# Patient Record
Sex: Female | Born: 1965 | Race: White | Hispanic: No | Marital: Married | Smoking: Never smoker
Health system: Southern US, Community
[De-identification: ages and names within clinical notes are randomized; demographics above are authoritative.]

---

## 2010-12-22 DIAGNOSIS — M25571 Pain in right ankle and joints of right foot: Secondary | ICD-10-CM | POA: Insufficient documentation

## 2011-12-03 ENCOUNTER — Emergency Department (INDEPENDENT_AMBULATORY_CARE_PROVIDER_SITE_OTHER): Payer: BC Managed Care – PPO

## 2011-12-03 ENCOUNTER — Encounter: Payer: Self-pay | Admitting: Emergency Medicine

## 2011-12-03 ENCOUNTER — Emergency Department (INDEPENDENT_AMBULATORY_CARE_PROVIDER_SITE_OTHER)
Admission: EM | Admit: 2011-12-03 | Discharge: 2011-12-03 | Disposition: A | Discharge status: Home / Self Care | Payer: BC Managed Care – PPO | Source: Home / Self Care

## 2011-12-03 DIAGNOSIS — M79609 Pain in unspecified limb: Secondary | ICD-10-CM

## 2011-12-03 DIAGNOSIS — M722 Plantar fascial fibromatosis: Secondary | ICD-10-CM

## 2011-12-03 NOTE — ED Notes (Signed)
Right heel pain x 1 month

## 2011-12-03 NOTE — ED Provider Notes (Signed)
History     CSN: [id number]  Arrival date & time 12/03/11  1712   First MD Initiated Contact with Patient 12/03/11 1715      Chief Complaint  Patient presents with  . Foot Pain  HPI Comments: Has noticed R heel pain x 1 month.  This has never happened before.  Pain predominantly on medial aspect of heel.  Pain worse in am  Also after prolonged standing.  Pt states that she works on feet all day.  No numbness or tingling.    Patient is a 46 y.o. female presenting with lower extremity pain.  Foot Pain This is a new problem. Episode onset: 1 month  The problem occurs constantly. Exacerbated by: prolonged standing; pain initially flares in am and then progressively improves over 1st 3-4 hours  Relieved by: rest  Treatments tried: tylenol and NSAIDs.  The treatment provided mild relief.    History reviewed. No pertinent past medical history.  History reviewed. No pertinent past surgical history.  Family History  Problem Relation Age of Onset  . Hypertension Father   . Diabetes Father     History  Substance Use Topics  . Smoking status: Not on file  . Smokeless tobacco: Not on file  . Alcohol Use:     OB History    Grav Para Term Preterm Abortions TAB SAB Ect Mult Living                  Review of Systems  All other systems reviewed and are negative.    Allergies  Penicillins and Sulfa antibiotics  Home Medications  No current outpatient prescriptions on file.  BP 113/74  Pulse 77  Temp 98.1 F (36.7 C) (Oral)  Resp 16  Ht 5\' 5"  (1.651 m)  Wt 160 lb (72.576 kg)  BMI 26.63 kg/m2  SpO2 97%  LMP 11/10/2011  Physical Exam  Constitutional: She appears well-developed and well-nourished.  HENT:  Head: Normocephalic and atraumatic.  Eyes: Conjunctivae are normal. Pupils are equal, round, and reactive to light.  Neck: Normal range of motion. Neck supple.  Cardiovascular: Normal rate and regular rhythm.   Pulmonary/Chest: Effort normal and breath sounds  normal.  Musculoskeletal:       Feet:    ED Course  Procedures (including critical care time)  Labs Reviewed - No data to display No results found.   No diagnosis found.    MDM  Exam and history clinically consistent with plantar fasciitis.  Will place in heel lift.  Discussed proper shoe cushioning and general exercises.  Follow up with sports medicine in 1-2 weeks.  Handout given Follow up as needed.      The patient and/or caregiver has been counseled thoroughly with regard to treatment plan and/or medications prescribed including dosage, schedule, interactions, rationale for use, and possible side effects and they verbalize understanding. Diagnoses and expected course of recovery discussed and will return if not improved as expected or if the condition worsens. Patient and/or caregiver verbalized understanding.             Floydene Flock, MD 12/03/11 [phone]

## 2011-12-09 NOTE — ED Provider Notes (Signed)
Agree with exam, assessment, and plan.   Stephen A Beese, MD 12/09/11 1547 

## 2013-01-21 ENCOUNTER — Emergency Department
Admission: EM | Admit: 2013-01-21 | Discharge: 2013-01-21 | Disposition: A | Discharge status: Home / Self Care | Payer: BC Managed Care – PPO | Source: Home / Self Care | Attending: Family Medicine | Admitting: Family Medicine

## 2013-01-21 ENCOUNTER — Encounter: Payer: Self-pay | Admitting: Emergency Medicine

## 2013-01-21 DIAGNOSIS — J069 Acute upper respiratory infection, unspecified: Secondary | ICD-10-CM

## 2013-01-21 MED ORDER — GUAIFENESIN-CODEINE 100-10 MG/5ML PO SYRP: ORAL_SOLUTION | ORAL | Status: DC

## 2013-01-21 MED ORDER — AZITHROMYCIN 250 MG PO TABS: ORAL_TABLET | ORAL | Status: DC

## 2013-01-21 NOTE — ED Notes (Signed)
Patient reports congestion and greenish productive cough x 1 week; here with granddaughter who has same condition.

## 2013-01-21 NOTE — ED Provider Notes (Signed)
CSN: [id number]     Arrival date & time 01/21/13  1653 History   First MD Initiated Contact with Patient 01/21/13 1730     Chief Complaint  Patient presents with  . Nasal Congestion  . Cough      HPI Comments: Patient developed URI symptoms one week ago with mild sore throat, sinus congestion, and cough.  The cough is now worse at night and non-productive.  No fevers, chills, and sweats.  Her grand-daughter developed similar symptoms at the same time.  The history is provided by the patient.    History reviewed. No pertinent past medical history. History reviewed. No pertinent past surgical history. Family History  Problem Relation Age of Onset  . Hypertension Father   . Diabetes Father   . Diabetes Mother   . Heart failure Mother    History  Substance Use Topics  . Smoking status: Never Smoker   . Smokeless tobacco: Not on file  . Alcohol Use: No   OB History   Grav Para Term Preterm Abortions TAB SAB Ect Mult Living                 Review of Systems + sore throat + cough No pleuritic pain + wheezing + nasal congestion + post-nasal drainage No sinus pain/pressure No itchy/red eyes ? earache No hemoptysis No SOB No fever, + chills No nausea No vomiting No abdominal pain No diarrhea No urinary symptoms No skin rashes + fatigue + myalgias + headache Used OTC meds without relief  Allergies  Penicillins and Sulfa antibiotics  Home Medications   Current Outpatient Rx  Name  Route  Sig  Dispense  Refill  . azithromycin (ZITHROMAX Z-PAK) 250 MG tablet      Take 2 tabs today; then begin one tab once daily for 4 more days. (Rx void after 01/29/13)   6 each   0   . guaiFENesin-codeine (GUAIFENESIN AC) 100-10 MG/5ML syrup      Take 10mL by mouth as needed for night-time cough   120 mL   0    BP 130/83  Pulse 84  Temp(Src) 98.1 F (36.7 C) (Oral)  Resp 16  Ht 5\' 5"  (1.651 m)  Wt 170 lb (77.111 kg)  BMI 28.29 kg/m2  SpO2 99%  LMP  01/09/2013 Physical Exam Nursing notes and Vital Signs reviewed. Appearance:  Patient appears healthy, stated age, and in no acute distress Eyes:  Pupils are equal, round, and reactive to light and accomodation.  Extraocular movement is intact.  Conjunctivae are not inflamed  Ears:  Canals normal.  Tympanic membranes normal.  Nose:  Mildly congested turbinates.  No sinus tenderness.     Pharynx:  Normal Neck:  Supple.   Tender shotty posterior nodes are palpated bilaterally  Lungs:  Clear to auscultation.  Breath sounds are equal.  Chest:  Distinct tenderness to palpation over the mid-sternum.  Heart:  Regular rate and rhythm without murmurs, rubs, or gallops.  Abdomen:  Nontender without masses or hepatosplenomegaly.  Bowel sounds are present.  No CVA or flank tenderness.  Extremities:  No edema.  No calf tenderness Skin:  No rash present.   ED Course  Procedures  none     MDM   1. Acute upper respiratory infections of unspecified site; suspect viral URI     There is no evidence of bacterial infection today.   Treat symptomatically for now  Rx for Robitussin AC at bedtime Take Mucinex D (guaifenesin with decongestant) twice  daily for congestion.  (Or may take plain Mucinex with Sudafed as needed).  Increase fluid intake, rest. May use Afrin nasal spray (or generic oxymetazoline) twice daily for about 5 days.  Also recommend using saline nasal spray several times daily and saline nasal irrigation (AYR is a common brand) Stop all antihistamines for now, and other non-prescription cough/cold preparations. May take Ibuprofen 200mg , 4 tabs every 8 hours with food for chest/sternum discomfort. Begin Azithromycin if not improving about one week or if persistent fever develops (Given a prescription to hold, with an expiration date)  Follow-up with family doctor if not improving about10 days.    Lattie Haw, MD 01/21/13 [phone]

## 2013-06-08 ENCOUNTER — Ambulatory Visit: Payer: BC Managed Care – PPO | Admitting: Physician Assistant

## 2013-06-13 ENCOUNTER — Ambulatory Visit: Payer: BC Managed Care – PPO | Admitting: Physician Assistant

## 2013-06-13 DIAGNOSIS — Z0289 Encounter for other administrative examinations: Secondary | ICD-10-CM

## 2013-12-26 IMAGING — CR DG OS CALCIS 2+V*R*
2 series · 2 of 2 positions shown · non-contrast
Comparison: None

CLINICAL DATA: Right heel pain.

RIGHT OS CALCIS - 2+ VIEW

[view not recorded (1 of 2)]
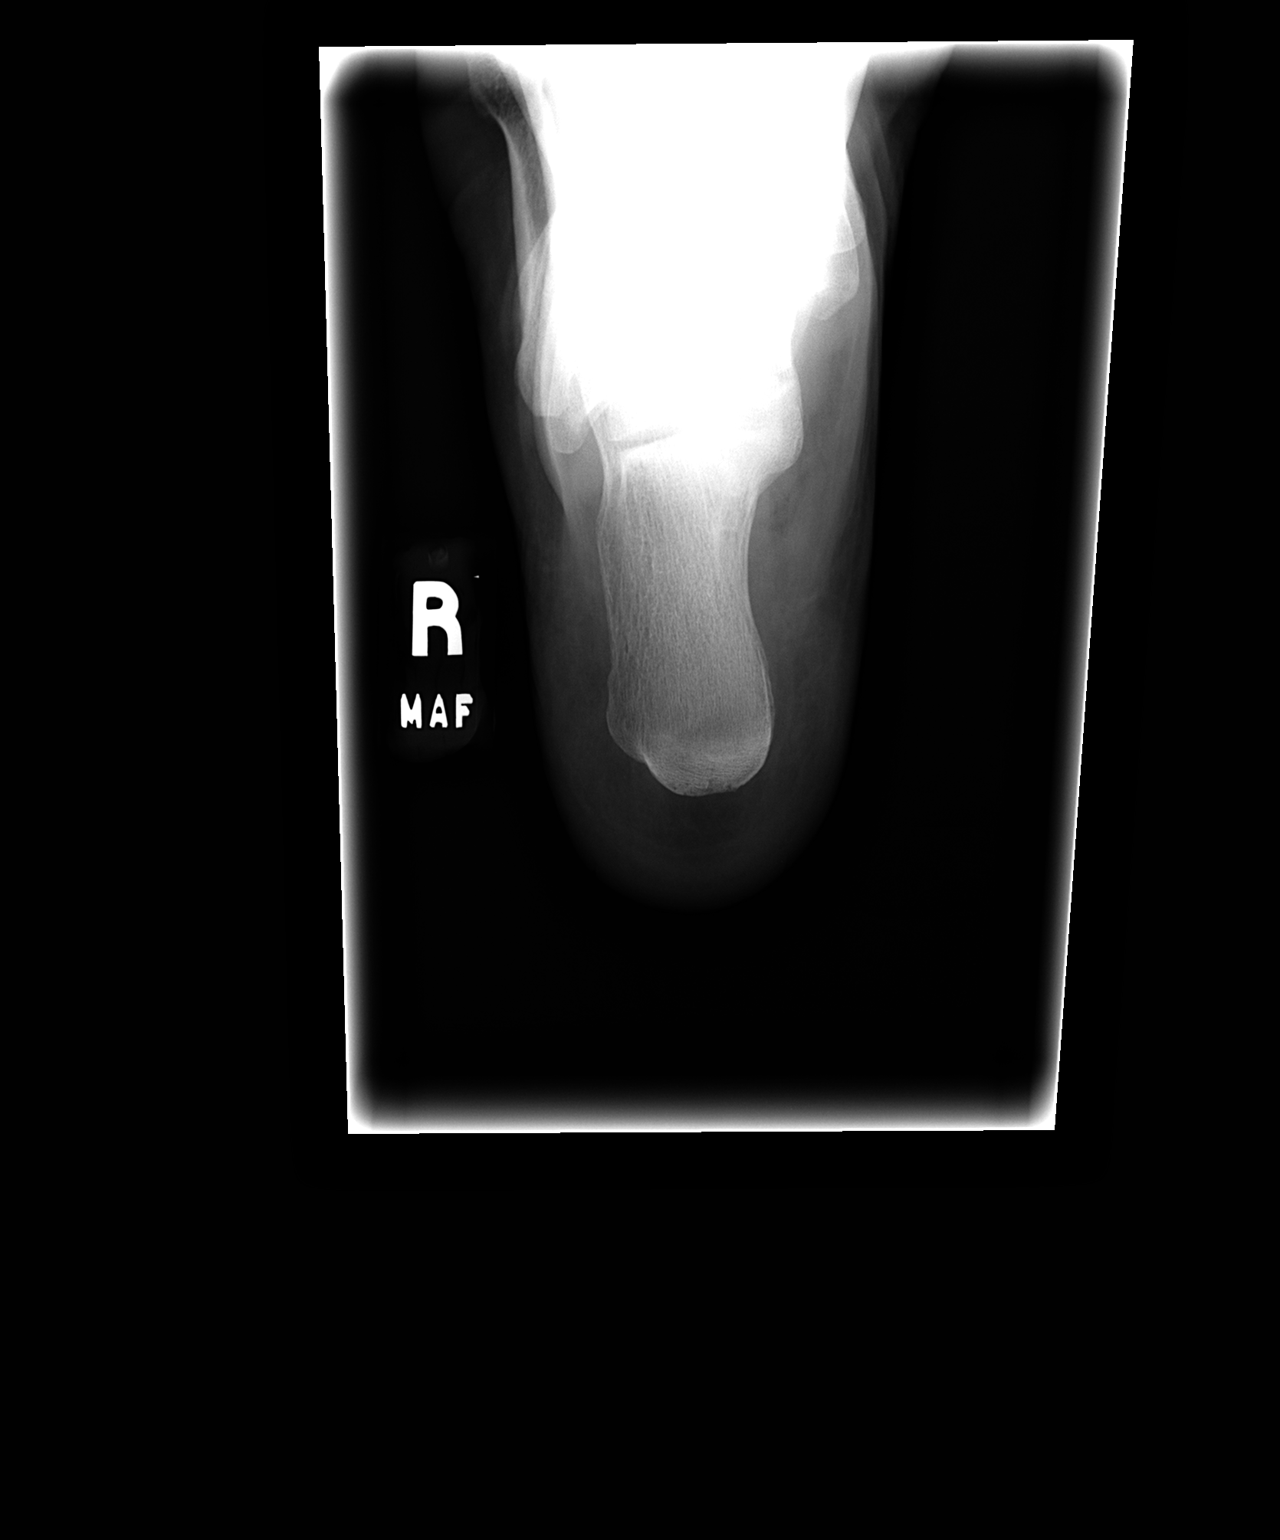

[view not recorded (2 of 2)]
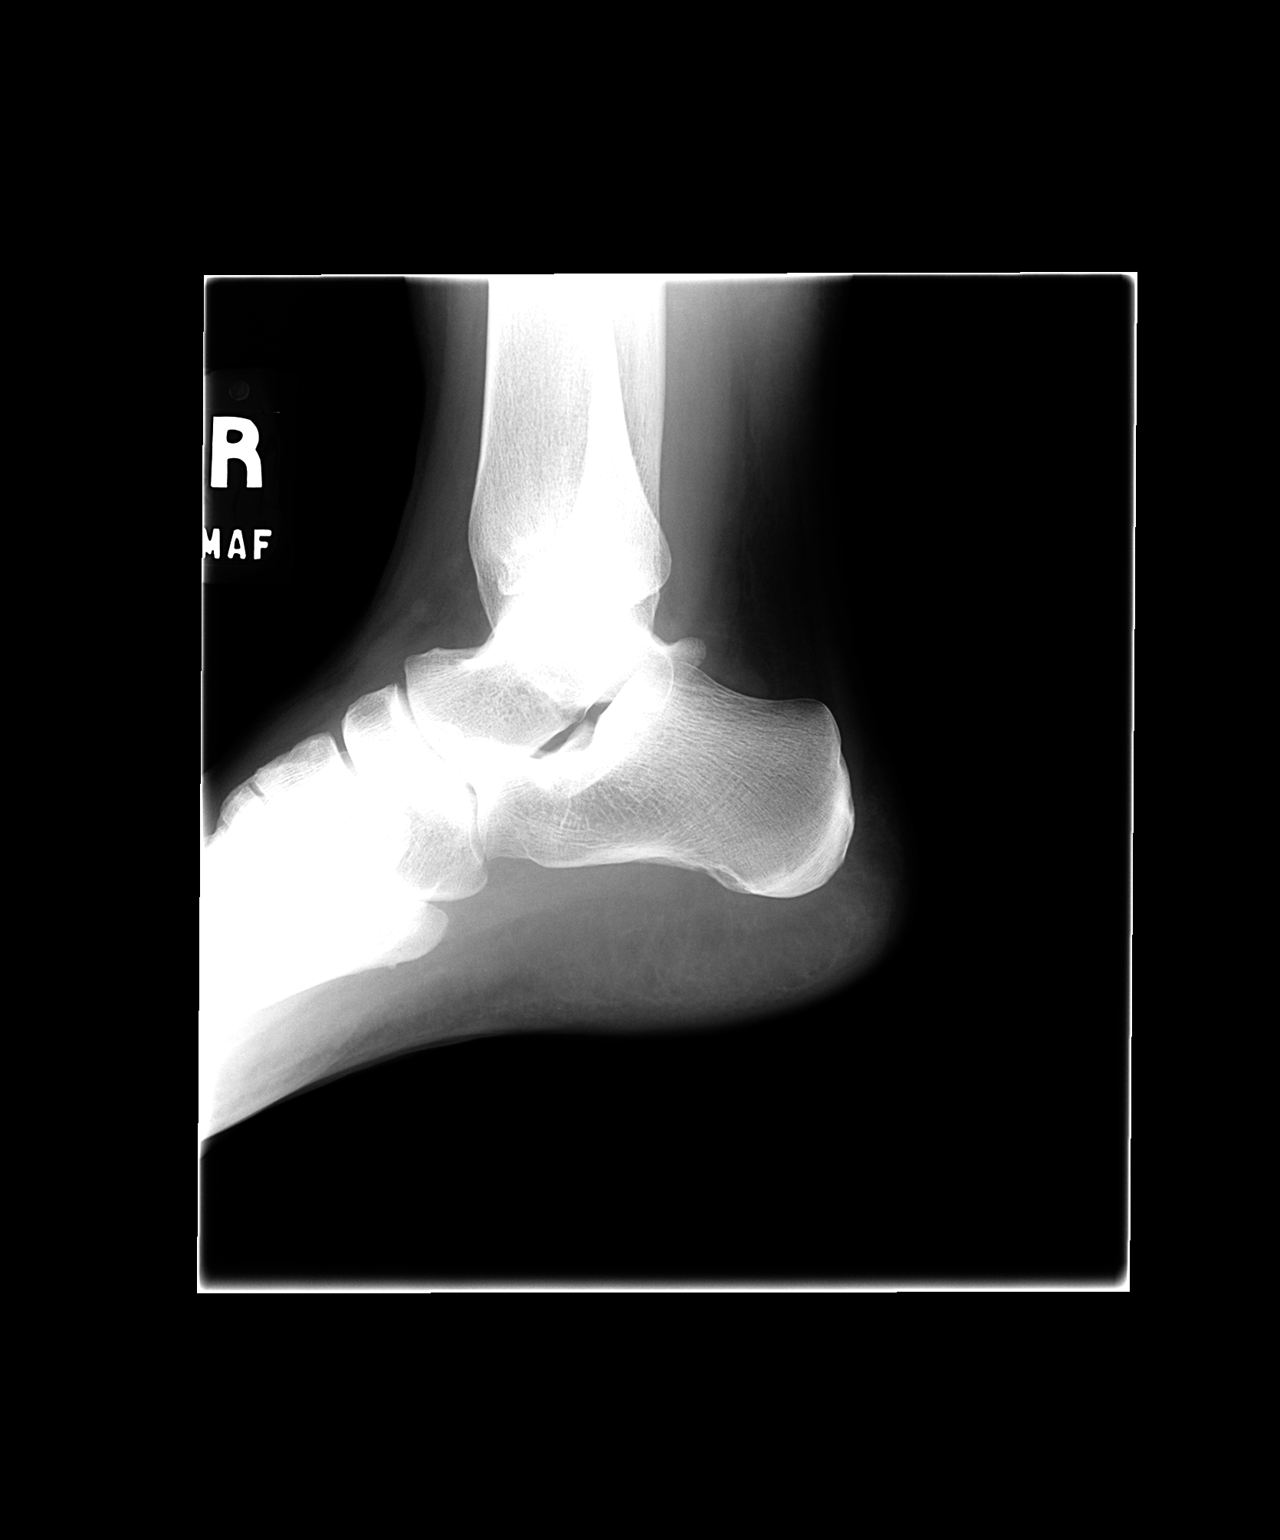

[2 of 2 positions shown; findings below may reference images not displayed]

FINDINGS: The mid and hind foot bony structures appear normal.  No
evidence of a calcaneal stress fracture.  No calcaneal heel spur.
No findings for tarsal coalition.  The heel pad appears mildly
thickened measuring 21.5 mm.  This could suggest inflammation or
could be a normal variant.
IMPRESSION: No acute bony findings.
Mild thickening of the heel pad of uncertain significance.

## 2015-07-21 DIAGNOSIS — F419 Anxiety disorder, unspecified: Secondary | ICD-10-CM | POA: Insufficient documentation

## 2015-07-21 DIAGNOSIS — E559 Vitamin D deficiency, unspecified: Secondary | ICD-10-CM | POA: Insufficient documentation

## 2015-07-21 DIAGNOSIS — D696 Thrombocytopenia, unspecified: Secondary | ICD-10-CM | POA: Insufficient documentation

## 2017-07-08 ENCOUNTER — Other Ambulatory Visit: Payer: Self-pay

## 2017-07-08 ENCOUNTER — Emergency Department (INDEPENDENT_AMBULATORY_CARE_PROVIDER_SITE_OTHER)
Admission: EM | Admit: 2017-07-08 | Discharge: 2017-07-08 | Disposition: A | Discharge status: Home / Self Care | Payer: No Typology Code available for payment source | Source: Home / Self Care | Attending: Family Medicine | Admitting: Family Medicine

## 2017-07-08 ENCOUNTER — Encounter: Payer: Self-pay | Admitting: Emergency Medicine

## 2017-07-08 DIAGNOSIS — R69 Illness, unspecified: Secondary | ICD-10-CM | POA: Diagnosis not present

## 2017-07-08 DIAGNOSIS — J111 Influenza due to unidentified influenza virus with other respiratory manifestations: Secondary | ICD-10-CM

## 2017-07-08 MED ORDER — GUAIFENESIN-CODEINE 100-10 MG/5ML PO SOLN: ORAL | 0 refills | Status: AC

## 2017-07-08 MED ORDER — AZITHROMYCIN 250 MG PO TABS: ORAL_TABLET | ORAL | 0 refills | Status: AC

## 2017-07-08 NOTE — Discharge Instructions (Signed)
Take plain guaifenesin (1200mg extended release tabs such as Mucinex) twice daily, with plenty of water, for cough and congestion.  May add Pseudoephedrine (30mg, one or two every 4 to 6 hours) for sinus congestion.  Get adequate rest.   °May use Afrin nasal spray (or generic oxymetazoline) each morning for about 5 days and then discontinue.  Also recommend using saline nasal spray several times daily and saline nasal irrigation (AYR is a common brand).  Use Flonase nasal spray each morning after using Afrin nasal spray and saline nasal irrigation. °Try warm salt water gargles for sore throat.  °Stop all antihistamines for now, and other non-prescription cough/cold preparations. °May take Ibuprofen 200mg, 4 tabs every 8 hours with food for chest/sternum discomfort. °  °

## 2017-07-08 NOTE — ED Provider Notes (Signed)
[facility]    CSN: [id number] Arrival date & time: 07/08/17  0949     History   Chief Complaint Chief Complaint  Patient presents with  . Cough    HPI Jocelyn Valentine is a 52 y.o. female.   Four days ago patient developed flu-like symptoms including myalgias, headache, chills/sweats, fatigue, and cough.  Also has mild nasal congestion and sore throat.  Cough is non-productive and worse at night.  No pleuritic pain but feels tight in her anterior chest.     The history is provided by the patient.    History reviewed. No pertinent past medical history.  There are no active problems to display for this patient.   History reviewed. No pertinent surgical history.  OB History    No data available       Home Medications    Prior to Admission medications   Medication Sig Start Date End Date Taking? Authorizing Provider  azithromycin (ZITHROMAX Z-PAK) 250 MG tablet Take 2 tabs today; then begin one tab once daily for 4 more days. 07/08/17   Lattie Haw, MD  guaiFENesin-codeine 100-10 MG/5ML syrup Take 10mL by mouth at bedtime as needed for cough.  May repeat dose in 4 to 6 hours. 07/08/17   Lattie Haw, MD    Family History Family History  Problem Relation Age of Onset  . Hypertension Father   . Diabetes Father   . Diabetes Mother   . Heart failure Mother     Social History Social History   Tobacco Use  . Smoking status: Never Smoker  . Smokeless tobacco: Never Used  Substance Use Topics  . Alcohol use: No  . Drug use: No     Allergies   Penicillins and Sulfa antibiotics   Review of Systems Review of Systems + sore throat + cough No pleuritic pain, but feels tight in anterior chest No wheezing + nasal congestion + post-nasal drainage No sinus pain/pressure No itchy/red eyes ? earache No hemoptysis No SOB ? fever, + chills No nausea No vomiting No abdominal pain No diarrhea No urinary symptoms No skin rash + fatigue +  myalgias + headache Used OTC meds without relief   Physical Exam Triage Vital Signs ED Triage Vitals  Enc Vitals Group     BP 07/08/17 1029 131/75     Pulse Rate 07/08/17 1029 (!) 109     Resp --      Temp 07/08/17 1029 98.3 F (36.8 C)     Temp Source 07/08/17 1029 Oral     SpO2 07/08/17 1029 98 %     Weight 07/08/17 1030 157 lb (71.2 kg)     Height 07/08/17 1030 5\' 5"  (1.651 m)     Head Circumference --      Peak Flow --      Pain Score 07/08/17 1030 2     Pain Loc --      Pain Edu? --      Excl. in GC? --    No data found.  Updated Vital Signs BP 131/75 (BP Location: Right Arm)   Pulse (!) 109   Temp 98.3 F (36.8 C) (Oral)   Ht 5\' 5"  (1.651 m)   Wt 157 lb (71.2 kg)   SpO2 98%   BMI 26.13 kg/m   Visual Acuity Right Eye Distance:   Left Eye Distance:   Bilateral Distance:    Right Eye Near:   Left Eye Near:    Bilateral Near:  Physical Exam Nursing notes and Vital Signs reviewed. Appearance:  Patient appears stated age, and in no acute distress Eyes:  Pupils are equal, round, and reactive to light and accomodation.  Extraocular movement is intact.  Conjunctivae are not inflamed  Ears:  Canals normal.  Tympanic membranes normal.  Nose:  Mildly congested turbinates.  No sinus tenderness.  Pharynx:  Normal Neck:  Supple.  Enlarged posterior/lateral nodes are palpated bilaterally, tender to palpation on the left.   Lungs:  Clear to auscultation.  Breath sounds are equal.  Moving air well. Chest:  Distinct tenderness to palpation over the mid-sternum.  Heart:  Regular rate and rhythm without murmurs, rubs, or gallops.  Rate 109 Abdomen:  Nontender without masses or hepatosplenomegaly.  Bowel sounds are present.  No CVA or flank tenderness.  Extremities:  No edema.  Skin:  No rash present.    UC Treatments / Results  Labs (all labs ordered are listed, but only abnormal results are displayed) Labs Reviewed - No data to display  EKG  EKG  Interpretation None       Radiology No results found.  Procedures Procedures (including critical care time)  Medications Ordered in UC Medications - No data to display   Initial Impression / Assessment and Plan / UC Course  I have reviewed the triage vital signs and the nursing notes.  Pertinent labs & imaging results that were available during my care of the patient were reviewed by me and considered in my medical decision making (see chart for details).    Patient has missed 48 hour window of opportunity for beginning Tamiflu.  Begin empiric Z-pak for atypical coverage. Rx for Robitussin AC for night time cough.  Controlled Substance Prescriptions I have consulted the [place] Controlled Substances Registry for this patient, and feel the risk/benefit ratio today is favorable for proceeding with this prescription for a controlled substance.   May use Afrin nasal spray (or generic oxymetazoline) each morning for about 5 days and then discontinue.  Also recommend using saline nasal spray several times daily and saline nasal irrigation (AYR is a common brand).  Use Flonase nasal spray each morning after using Afrin nasal spray and saline nasal irrigation. Try warm salt water gargles for sore throat.  Stop all antihistamines for now, and other non-prescription cough/cold preparations. May take Ibuprofen 200mg , 4 tabs every 8 hours with food for chest/sternum discomfort. Followup with Family Doctor if not improved in one week.   Final Clinical Impressions(s) / UC Diagnoses   Final diagnoses:  Influenza-like illness    ED Discharge Orders        Ordered    azithromycin (ZITHROMAX Z-PAK) 250 MG tablet     07/08/17 1126    guaiFENesin-codeine 100-10 MG/5ML syrup     07/08/17 1126          Lattie HawBeese, Jessamy Torosyan A, MD 07/08/17 1138

## 2017-07-08 NOTE — ED Triage Notes (Signed)
Cough with yellow sputum, congestion, fever blister, LEFT ear itchy, chills x 3 days

## 2018-10-16 ENCOUNTER — Other Ambulatory Visit: Payer: Self-pay

## 2018-10-16 ENCOUNTER — Emergency Department
Admission: EM | Admit: 2018-10-16 | Discharge: 2018-10-16 | Disposition: A | Discharge status: Home / Self Care | Payer: BC Managed Care – PPO | Source: Home / Self Care

## 2018-10-16 DIAGNOSIS — R030 Elevated blood-pressure reading, without diagnosis of hypertension: Secondary | ICD-10-CM

## 2018-10-16 DIAGNOSIS — H00012 Hordeolum externum right lower eyelid: Secondary | ICD-10-CM | POA: Diagnosis not present

## 2018-10-16 DIAGNOSIS — R220 Localized swelling, mass and lump, head: Secondary | ICD-10-CM

## 2018-10-16 MED ORDER — CEPHALEXIN 500 MG PO CAPS: 500.0000 mg | ORAL_CAPSULE | Freq: Two times a day (BID) | ORAL | 0 refills | Status: AC

## 2018-10-16 MED ORDER — OLOPATADINE HCL 0.1 % OP SOLN: 1.0000 [drp] | Freq: Two times a day (BID) | OPHTHALMIC | 0 refills | Status: AC

## 2018-10-16 MED ORDER — ERYTHROMYCIN 5 MG/GM OP OINT: TOPICAL_OINTMENT | Freq: Four times a day (QID) | OPHTHALMIC | 0 refills | Status: AC

## 2018-10-16 NOTE — ED Provider Notes (Signed)
[facility]    CSN: [id number] Arrival date & time: 10/16/18  1704     History   Chief Complaint Chief Complaint  Patient presents with  . Eye Problem    HPI Jocelyn Valentine is a 53 y.o. female.   HPI  Jocelyn Valentine is a 53 y.o. female presenting to UC with c/o pain, redness, and swelling of Right lower eyelid for 3 days, gradually worsening. Hx of styes in the past, but current area is much more painful than prior styes. She also notes she has swelling and redness below her Right eye.  Mild nasal congestion. Denies HA, dizziness, change in vision, ear pain, cough or sore throat. Denies fever or chills.   History reviewed. No pertinent past medical history.  There are no active problems to display for this patient.   History reviewed. No pertinent surgical history.  OB History   No obstetric history on file.      Home Medications    Prior to Admission medications   Medication Sig Start Date End Date Taking? Authorizing Provider  azithromycin (ZITHROMAX Z-PAK) 250 MG tablet Take 2 tabs today; then begin one tab once daily for 4 more days. 07/08/17   Kandra Nicolas, MD  cephALEXin (KEFLEX) 500 MG capsule Take 1 capsule (500 mg total) by mouth 2 (two) times daily. 10/16/18   Noe Gens, PA-C  erythromycin ophthalmic ointment Place into the right eye 4 (four) times daily for 5 days. Place a 1/2 inch ribbon of ointment into the lower eyelid. 10/16/18 10/21/18  Noe Gens, PA-C  guaiFENesin-codeine 100-10 MG/5ML syrup Take 91mL by mouth at bedtime as needed for cough.  May repeat dose in 4 to 6 hours. 07/08/17   Kandra Nicolas, MD  olopatadine (PATANOL) 0.1 % ophthalmic solution Place 1 drop into both eyes 2 (two) times daily. 10/16/18   Noe Gens, PA-C    Family History Family History  Problem Relation Age of Onset  . Hypertension Father   . Diabetes Father   . Diabetes Mother   . Heart failure Mother     Social History Social History   Tobacco Use   . Smoking status: Never Smoker  . Smokeless tobacco: Never Used  Substance Use Topics  . Alcohol use: No  . Drug use: No     Allergies   Penicillins and Sulfa antibiotics   Review of Systems Review of Systems  Constitutional: Negative for chills and fever.  HENT: Positive for congestion. Negative for ear pain, sinus pressure, sinus pain and sneezing.   Eyes: Positive for pain, redness and itching. Negative for photophobia, discharge and visual disturbance.  Respiratory: Negative for cough.   Neurological: Negative for dizziness, light-headedness and headaches.     Physical Exam Triage Vital Signs ED Triage Vitals  Enc Vitals Group     BP 10/16/18 1819 (!) 152/98     Pulse Rate 10/16/18 1819 69     Resp 10/16/18 1819 20     Temp 10/16/18 1819 97.9 F (36.6 C)     Temp Source 10/16/18 1819 Oral     SpO2 10/16/18 1819 98 %     Weight 10/16/18 1820 169 lb (76.7 kg)     Height 10/16/18 1820 5\' 5"  (1.651 m)     Head Circumference --      Peak Flow --      Pain Score 10/16/18 1820 7     Pain Loc --  Pain Edu? --      Excl. in GC? --    No data found.  Updated Vital Signs BP (!) 152/98 (BP Location: Right Arm)   Pulse 69   Temp 97.9 F (36.6 C) (Oral)   Resp 20   Ht 5\' 5"  (1.651 m)   Wt 169 lb (76.7 kg)   LMP 01/09/2013   SpO2 98%   BMI 28.12 kg/m   Visual Acuity Right Eye Distance: 20/20 Left Eye Distance: 20/20 Bilateral Distance: 20/20  Right Eye Near:   Left Eye Near:    Bilateral Near:     Physical Exam Vitals signs and nursing note reviewed.  Constitutional:      Appearance: Normal appearance. She is well-developed.  HENT:     Head: Normocephalic and atraumatic.      Comments: Large hordeolum Right lower eyelid with mild erythema and edema over Right maxillary sinus w/o tenderness.    Right Ear: Tympanic membrane normal.     Left Ear: Tympanic membrane normal.     Nose: Nose normal.     Right Sinus: No maxillary sinus tenderness or  frontal sinus tenderness.     Left Sinus: No maxillary sinus tenderness or frontal sinus tenderness.     Mouth/Throat:     Lips: Pink.     Mouth: Mucous membranes are moist.     Pharynx: Oropharynx is clear. Uvula midline.  Eyes:     General:        Right eye: Hordeolum present. No discharge.        Left eye: No discharge.     Extraocular Movements: Extraocular movements intact.     Conjunctiva/sclera: Conjunctivae normal.     Pupils: Pupils are equal, round, and reactive to light.   Neck:     Musculoskeletal: Normal range of motion.  Cardiovascular:     Rate and Rhythm: Normal rate and regular rhythm.  Pulmonary:     Effort: Pulmonary effort is normal. No respiratory distress.     Breath sounds: Normal breath sounds.  Musculoskeletal: Normal range of motion.  Skin:    General: Skin is warm and dry.  Neurological:     Mental Status: She is alert and oriented to person, place, and time.  Psychiatric:        Behavior: Behavior normal.      UC Treatments / Results  Labs (all labs ordered are listed, but only abnormal results are displayed) Labs Reviewed - No data to display  EKG None  Radiology No results found.  Procedures Procedures (including critical care time)  Medications Ordered in UC Medications - No data to display  Initial Impression / Assessment and Plan / UC Course  I have reviewed the triage vital signs and the nursing notes.  Pertinent labs & imaging results that were available during my care of the patient were reviewed by me and considered in my medical decision making (see chart for details).     Hx and exam c/w hordeolum, will cover for early cellulitis given redness and swelling below eye away from the hordeolum.  Home care info provided.  Final Clinical Impressions(s) / UC Diagnoses   Final diagnoses:  Hordeolum externum of right lower eyelid  Right facial swelling  Elevated blood pressure reading     Discharge Instructions       Please take and use the prescribed medication for the whole duration to help prevent infection from returning. If you continue to have infections in and around your eyes,  please follow up with an eye specialist for further evaluation and treatment.  Your blood pressure was elevated today. Please follow up with family medicine for ongoing healthcare needs including blood pressure monitoring.    ED Prescriptions    Medication Sig Dispense Auth. Provider   olopatadine (PATANOL) 0.1 % ophthalmic solution Place 1 drop into both eyes 2 (two) times daily. 5 mL Waylan RocherPhelps, Zamara Cozad O, PA-C   erythromycin ophthalmic ointment Place into the right eye 4 (four) times daily for 5 days. Place a 1/2 inch ribbon of ointment into the lower eyelid. 3.5 g Doroteo GlassmanPhelps, Dannielle Baskins O, PA-C   cephALEXin (KEFLEX) 500 MG capsule Take 1 capsule (500 mg total) by mouth 2 (two) times daily. 14 capsule Lurene ShadowPhelps, Felicity Penix O, PA-C     Controlled Substance Prescriptions [place] Controlled Substance Registry consulted? Not Applicable   Rolla Platehelps, Gatlin Kittell O, PA-C 10/17/18 [id number]

## 2018-10-16 NOTE — Discharge Instructions (Signed)
°  Please take and use the prescribed medication for the whole duration to help prevent infection from returning. If you continue to have infections in and around your eyes, please follow up with an eye specialist for further evaluation and treatment.  Your blood pressure was elevated today. Please follow up with family medicine for ongoing healthcare needs including blood pressure monitoring.

## 2018-10-16 NOTE — ED Triage Notes (Signed)
Pt has swelling in the right lower lid.  Painful to touch

## 2019-02-01 ENCOUNTER — Emergency Department
Admission: EM | Admit: 2019-02-01 | Discharge: 2019-02-01 | Disposition: A | Discharge status: Home / Self Care | Payer: BC Managed Care – PPO | Source: Home / Self Care

## 2019-02-01 ENCOUNTER — Other Ambulatory Visit: Payer: Self-pay

## 2019-02-01 DIAGNOSIS — M545 Low back pain, unspecified: Secondary | ICD-10-CM

## 2019-02-01 DIAGNOSIS — R350 Frequency of micturition: Secondary | ICD-10-CM

## 2019-02-01 LAB — POCT URINALYSIS DIP (MANUAL ENTRY)
Bilirubin, UA: NEGATIVE
Blood, UA: NEGATIVE
Glucose, UA: NEGATIVE mg/dL
Ketones, POC UA: NEGATIVE mg/dL
Leukocytes, UA: NEGATIVE
Nitrite, UA: NEGATIVE
Protein Ur, POC: NEGATIVE mg/dL
Spec Grav, UA: 1.01 (ref 1.010–1.025)
Urobilinogen, UA: 0.2 E.U./dL
pH, UA: 6.5 (ref 5.0–8.0)

## 2019-02-01 MED ORDER — CYCLOBENZAPRINE HCL 5 MG PO TABS: 5.0000 mg | ORAL_TABLET | Freq: Two times a day (BID) | ORAL | 0 refills | Status: AC | PRN

## 2019-02-01 MED ORDER — NAPROXEN 375 MG PO TABS: 375.0000 mg | ORAL_TABLET | Freq: Two times a day (BID) | ORAL | 0 refills | Status: AC

## 2019-02-01 NOTE — Discharge Instructions (Signed)
°  Flexeril (cyclobenzaprine) is a muscle relaxer and may cause drowsiness. Do not drink alcohol, drive, or operate heavy machinery while taking.  You may take 500mg  acetaminophen every 4-6 hours or in combination with the prescribed naproxen you can take twice daily for pain and inflammation. You may also alternate cool and warm compresses to help with low back pain.  Please follow up with family medicine in 1-2 weeks if not improving, sooner if symptoms worsening.

## 2019-02-01 NOTE — ED Triage Notes (Signed)
Pt c/o urinary frequency and lower back pain, LT flank. Taking ibuprofen prn.

## 2019-02-01 NOTE — ED Provider Notes (Signed)
[facility]    CSN: [id number] Arrival date & time: 02/01/19  1637      History   Chief Complaint Chief Complaint  Patient presents with  . Urinary Frequency    Lower back pain    HPI Jocelyn Valentine is a 53 y.o. female.   HPI  Jocelyn Valentine is a 53 y.o. female presenting to UC with c/o Left low back pain that started 3-4 days ago, pain initially woke her from her sleep. Associated urinary frequency. Pt concerned she may have a UTI.  Denies known injury to her back but pain is worse with certain movements. Pain is aching, 8/10.  Denies fever, chills, n/v/d. She has taken ibuprofen intermittently with mild relief.  Denies radiation of pain or numbness in arms or legs.    History reviewed. No pertinent past medical history.  There are no active problems to display for this patient.   History reviewed. No pertinent surgical history.  OB History   No obstetric history on file.      Home Medications    Prior to Admission medications   Medication Sig Start Date End Date Taking? Authorizing Provider  azithromycin (ZITHROMAX Z-PAK) 250 MG tablet Take 2 tabs today; then begin one tab once daily for 4 more days. 07/08/17   Kandra Nicolas, MD  cephALEXin (KEFLEX) 500 MG capsule Take 1 capsule (500 mg total) by mouth 2 (two) times daily. 10/16/18   Noe Gens, PA-C  cyclobenzaprine (FLEXERIL) 5 MG tablet Take 1-2 tablets (5-10 mg total) by mouth 2 (two) times daily as needed for muscle spasms. 02/01/19   Noe Gens, PA-C  guaiFENesin-codeine 100-10 MG/5ML syrup Take 60mL by mouth at bedtime as needed for cough.  May repeat dose in 4 to 6 hours. 07/08/17   Kandra Nicolas, MD  naproxen (NAPROSYN) 375 MG tablet Take 1 tablet (375 mg total) by mouth 2 (two) times daily. 02/01/19   Noe Gens, PA-C  olopatadine (PATANOL) 0.1 % ophthalmic solution Place 1 drop into both eyes 2 (two) times daily. 10/16/18   Noe Gens, PA-C    Family History Family History  Problem  Relation Age of Onset  . Hypertension Father   . Diabetes Father   . Diabetes Mother   . Heart failure Mother     Social History Social History   Tobacco Use  . Smoking status: Never Smoker  . Smokeless tobacco: Never Used  Substance Use Topics  . Alcohol use: No  . Drug use: No     Allergies   Penicillins and Sulfa antibiotics   Review of Systems Review of Systems  Constitutional: Negative for chills and fever.  Gastrointestinal: Negative for abdominal pain, diarrhea, nausea and vomiting.  Genitourinary: Positive for flank pain (Left) and frequency. Negative for dysuria, pelvic pain and urgency.  Musculoskeletal: Positive for back pain and myalgias.  Neurological: Negative for weakness and numbness.     Physical Exam Triage Vital Signs ED Triage Vitals [02/01/19 1649]  Enc Vitals Group     BP (!) 138/91     Pulse Rate 78     Resp 18     Temp 97.8 F (36.6 C)     Temp Source Oral     SpO2 97 %     Weight 165 lb (74.8 kg)     Height 5\' 5"  (1.651 m)     Head Circumference      Peak Flow      Pain  Score 8     Pain Loc      Pain Edu?      Excl. in [place]?    No data found.  Updated Vital Signs BP (!) 138/91 (BP Location: Right Arm)   Pulse 78   Temp 97.8 F (36.6 C) (Oral)   Resp 18   Ht 5\' 5"  (1.651 m)   Wt 165 lb (74.8 kg)   LMP 01/09/2013   SpO2 97%   BMI 27.46 kg/m   Visual Acuity Right Eye Distance:   Left Eye Distance:   Bilateral Distance:    Right Eye Near:   Left Eye Near:    Bilateral Near:     Physical Exam Vitals signs and nursing note reviewed.  Constitutional:      Appearance: Normal appearance. She is well-developed.  HENT:     Head: Normocephalic and atraumatic.     Mouth/Throat:     Mouth: Mucous membranes are moist.  Neck:     Musculoskeletal: Normal range of motion.  Cardiovascular:     Rate and Rhythm: Normal rate and regular rhythm.  Pulmonary:     Effort: Pulmonary effort is normal. No respiratory distress.      Breath sounds: Normal breath sounds.  Abdominal:     General: There is no distension.     Palpations: Abdomen is soft.     Tenderness: There is no abdominal tenderness. There is no right CVA tenderness or left CVA tenderness.  Musculoskeletal: Normal range of motion.        General: Tenderness present.     Comments: No spinal tenderness. Tenderness to Left lumbar muscles and flank. No bony hip tenderness. Full ROM upper and lower extremities, negative straight leg raise.   Skin:    General: Skin is warm and dry.     Findings: No bruising, erythema or rash.  Neurological:     Mental Status: She is alert and oriented to person, place, and time.  Psychiatric:        Behavior: Behavior normal.      UC Treatments / Results  Labs (all labs ordered are listed, but only abnormal results are displayed) Labs Reviewed  POCT URINALYSIS DIP (MANUAL ENTRY)    EKG   Radiology No results found.  Procedures Procedures (including critical care time)  Medications Ordered in UC Medications - No data to display  Initial Impression / Assessment and Plan / UC Course  I have reviewed the triage vital signs and the nursing notes.  Pertinent labs & imaging results that were available during my care of the patient were reviewed by me and considered in my medical decision making (see chart for details).     UA: normal Exam c/w muscle strain Will tx conservatively for pain AVS provided.  Final Clinical Impressions(s) / UC Diagnoses   Final diagnoses:  Acute left-sided low back pain without sciatica  Urinary frequency     Discharge Instructions      Flexeril (cyclobenzaprine) is a muscle relaxer and may cause drowsiness. Do not drink alcohol, drive, or operate heavy machinery while taking.  You may take 500mg  acetaminophen every 4-6 hours or in combination with the prescribed naproxen you can take twice daily for pain and inflammation. You may also alternate cool and warm  compresses to help with low back pain.  Please follow up with family medicine in 1-2 weeks if not improving, sooner if symptoms worsening.    ED Prescriptions    Medication Sig Dispense Auth. Provider  cyclobenzaprine (FLEXERIL) 5 MG tablet Take 1-2 tablets (5-10 mg total) by mouth 2 (two) times daily as needed for muscle spasms. 30 tablet Doroteo Glassman, Deano Tomaszewski O, PA-C   naproxen (NAPROSYN) 375 MG tablet Take 1 tablet (375 mg total) by mouth 2 (two) times daily. 20 tablet Lurene Shadow, [place]     PDMP not reviewed this encounter.   Lurene Shadow, [place] 02/01/19 1842

## 2019-10-06 ENCOUNTER — Emergency Department: Payer: BC Managed Care – PPO

## 2019-10-06 ENCOUNTER — Emergency Department (INDEPENDENT_AMBULATORY_CARE_PROVIDER_SITE_OTHER)
Admission: EM | Admit: 2019-10-06 | Discharge: 2019-10-06 | Disposition: A | Discharge status: Home / Self Care | Payer: Worker's Compensation | Source: Home / Self Care

## 2019-10-06 ENCOUNTER — Other Ambulatory Visit: Payer: Self-pay

## 2019-10-06 ENCOUNTER — Emergency Department (INDEPENDENT_AMBULATORY_CARE_PROVIDER_SITE_OTHER): Payer: BC Managed Care – PPO

## 2019-10-06 ENCOUNTER — Encounter: Payer: Self-pay | Admitting: Emergency Medicine

## 2019-10-06 DIAGNOSIS — S9031XA Contusion of right foot, initial encounter: Secondary | ICD-10-CM

## 2019-10-06 DIAGNOSIS — S99921A Unspecified injury of right foot, initial encounter: Secondary | ICD-10-CM

## 2019-10-06 DIAGNOSIS — S90111A Contusion of right great toe without damage to nail, initial encounter: Secondary | ICD-10-CM

## 2019-10-06 DIAGNOSIS — S90421A Blister (nonthermal), right great toe, initial encounter: Secondary | ICD-10-CM

## 2019-10-06 NOTE — ED Triage Notes (Signed)
Pt hit r foot on a pole yesterday at 1030 at work Presents today w/ bruising & edema w/blisters

## 2019-10-06 NOTE — ED Provider Notes (Signed)
[facility]    CSN: [id number] Arrival date & time: 10/06/19  1545      History   Chief Complaint Chief Complaint  Patient presents with  . Foot Injury    HPI Jocelyn Valentine is a 54 y.o. female.   HPI  Jocelyn Valentine is a 54 y.o. female presenting to UC with c/o Right foot pain, bruising and blistering around great toe. Pain is aching and sore, minimal at this time. Symptoms started after she dropped a 25 pound metal weight pole on her foot yesterday while wearing tennis shoes. She states the swelling and pain were worse this morning, improved after soaking her foot in warm water and Epson salt.  She is concerned about the blisters on her toe, work encouraged her to come be evaluated.     History reviewed. No pertinent past medical history.  Patient Active Problem List   Diagnosis Date Noted  . Anxiety 07/21/2015  . Vitamin D deficiency 07/21/2015  . Thrombocytopenia (HCC) 07/21/2015  . Arthralgia of foot, right 12/22/2010  . Edema, peripheral 12/22/2010    History reviewed. No pertinent surgical history.  OB History   No obstetric history on file.      Home Medications    Prior to Admission medications   Medication Sig Start Date End Date Taking? Authorizing Provider  estradiol (ESTRACE) 0.1 MG/GM vaginal cream Place vaginally. 09/13/19  Yes [provider]  azithromycin (ZITHROMAX Z-PAK) 250 MG tablet Take 2 tabs today; then begin one tab once daily for 4 more days. 07/08/17   Lattie Haw, MD  cephALEXin (KEFLEX) 500 MG capsule Take 1 capsule (500 mg total) by mouth 2 (two) times daily. 10/16/18   Lurene Shadow, PA-C  cyclobenzaprine (FLEXERIL) 5 MG tablet Take 1-2 tablets (5-10 mg total) by mouth 2 (two) times daily as needed for muscle spasms. 02/01/19   Lurene Shadow, PA-C  guaiFENesin-codeine 100-10 MG/5ML syrup Take 24mL by mouth at bedtime as needed for cough.  May repeat dose in 4 to 6 hours. 07/08/17   Lattie Haw, MD  naproxen  (NAPROSYN) 375 MG tablet Take 1 tablet (375 mg total) by mouth 2 (two) times daily. 02/01/19   Lurene Shadow, PA-C  olopatadine (PATANOL) 0.1 % ophthalmic solution Place 1 drop into both eyes 2 (two) times daily. 10/16/18   Lurene Shadow, PA-C    Family History Family History  Problem Relation Age of Onset  . Hypertension Father   . Diabetes Father   . Diabetes Mother   . Heart failure Mother     Social History Social History   Tobacco Use  . Smoking status: Never Smoker  . Smokeless tobacco: Never Used  Substance Use Topics  . Alcohol use: No  . Drug use: No     Allergies   Penicillins and Sulfa antibiotics   Review of Systems Review of Systems  Musculoskeletal: Positive for arthralgias and joint swelling.  Skin: Positive for color change. Negative for wound.     Physical Exam Triage Vital Signs ED Triage Vitals [10/06/19 1558]  Enc Vitals Group     BP (!) 136/94     Pulse Rate 84     Resp 18     Temp 98.1 F (36.7 C)     Temp Source Oral     SpO2 97 %     Weight      Height      Head Circumference      Peak  Flow      Pain Score      Pain Loc      Pain Edu?      Excl. in [place]?    No data found.  Updated Vital Signs BP (!) 136/94 (BP Location: Right Arm)   Pulse 84   Temp 98.1 F (36.7 C) (Oral)   Resp 18   LMP 01/09/2013   SpO2 97%   Visual Acuity Right Eye Distance:   Left Eye Distance:   Bilateral Distance:    Right Eye Near:   Left Eye Near:    Bilateral Near:     Physical Exam Vitals and nursing note reviewed.  Constitutional:      Appearance: Normal appearance. She is well-developed.  HENT:     Head: Normocephalic and atraumatic.  Cardiovascular:     Rate and Rhythm: Normal rate.  Pulmonary:     Effort: Pulmonary effort is normal.  Musculoskeletal:        General: Swelling and tenderness present. Normal range of motion.     Cervical back: Normal range of motion.     Comments: Right foot: mild edema, tenderness at base of  great toe. Full ROM toes and ankle.  Skin:    General: Skin is warm and dry.     Capillary Refill: Capillary refill takes less than 2 seconds.     Comments: Right foot: skin in tact. Ecchymosis to great toe, distal aspect of 1st metatarsal into 2nd and 3rd toes. Three 1-64mm in tact blisters on great toe. Mildly tender.  Neurological:     Mental Status: She is alert and oriented to person, place, and time.     Sensory: No sensory deficit.  Psychiatric:        Behavior: Behavior normal.      UC Treatments / Results  Labs (all labs ordered are listed, but only abnormal results are displayed) Labs Reviewed - No data to display  EKG   Radiology DG Foot Complete Right  Result Date: 10/06/2019 CLINICAL DATA:  Pt dropped a metal pole onto right foot. Significant bruising to most of foot, with swelling. Most of bruising and some blistering around the great toe. Pt denies much pain. EXAM: RIGHT FOOT COMPLETE - 3+ VIEW COMPARISON:  Right heel radiographs 12/03/2011 FINDINGS: There is no evidence of fracture or dislocation. There are degenerative changes in the midfoot. No focal bone lesion. There is dorsal soft tissue swelling. IMPRESSION: No acute osseous abnormality identified in the right foot. Electronically Signed   By: Audie Pinto M.D.   On: 10/06/2019 16:14    Procedures Procedures (including critical care time)  Medications Ordered in UC Medications - No data to display  Initial Impression / Assessment and Plan / UC Course  I have reviewed the triage vital signs and the nursing notes.  Pertinent labs & imaging results that were available during my care of the patient were reviewed by me and considered in my medical decision making (see chart for details).     Reviewed imaging with pt Encouraged elevation and cool  (not cold) compresses F/u with PCP or Sports Medicine as needed AVS provided  Final Clinical Impressions(s) / UC Diagnoses   Final diagnoses:  Right foot  injury, initial encounter  Contusion of right great toe without damage to nail, initial encounter  Contusion of right foot, initial encounter  Blister of great toe of right foot, initial encounter     Discharge Instructions      Keep foot  elevated to help with swelling. You may take Tylenol and Motrin for pain and swelling. You can apply a cool (not cold) compress 2-3 times daily to help with pain and swelling. Do not apply cool compress for longer than 10 minutes at a time to help prevent skin damage.  Follow up with your primary care provider or sports medicine later this week if not improving.     ED Prescriptions    None     PDMP not reviewed this encounter.   Lurene Shadow, [place] 10/07/19 [phone]

## 2019-10-06 NOTE — Discharge Instructions (Signed)
  Keep foot elevated to help with swelling. You may take Tylenol and Motrin for pain and swelling. You can apply a cool (not cold) compress 2-3 times daily to help with pain and swelling. Do not apply cool compress for longer than 10 minutes at a time to help prevent skin damage.  Follow up with your primary care provider or sports medicine later this week if not improving.

## 2021-10-29 IMAGING — DX DG FOOT COMPLETE 3+V*R*
3 series · 3 of 3 positions shown · non-contrast
Comparison: Right heel radiographs 12/03/2011

CLINICAL DATA: Pt dropped a metal pole onto right foot. Significant
bruising to most of foot, with swelling. Most of bruising and some
blistering around the great toe. Pt denies much pain.

EXAM:
RIGHT FOOT COMPLETE - 3+ VIEW

[foot ap]
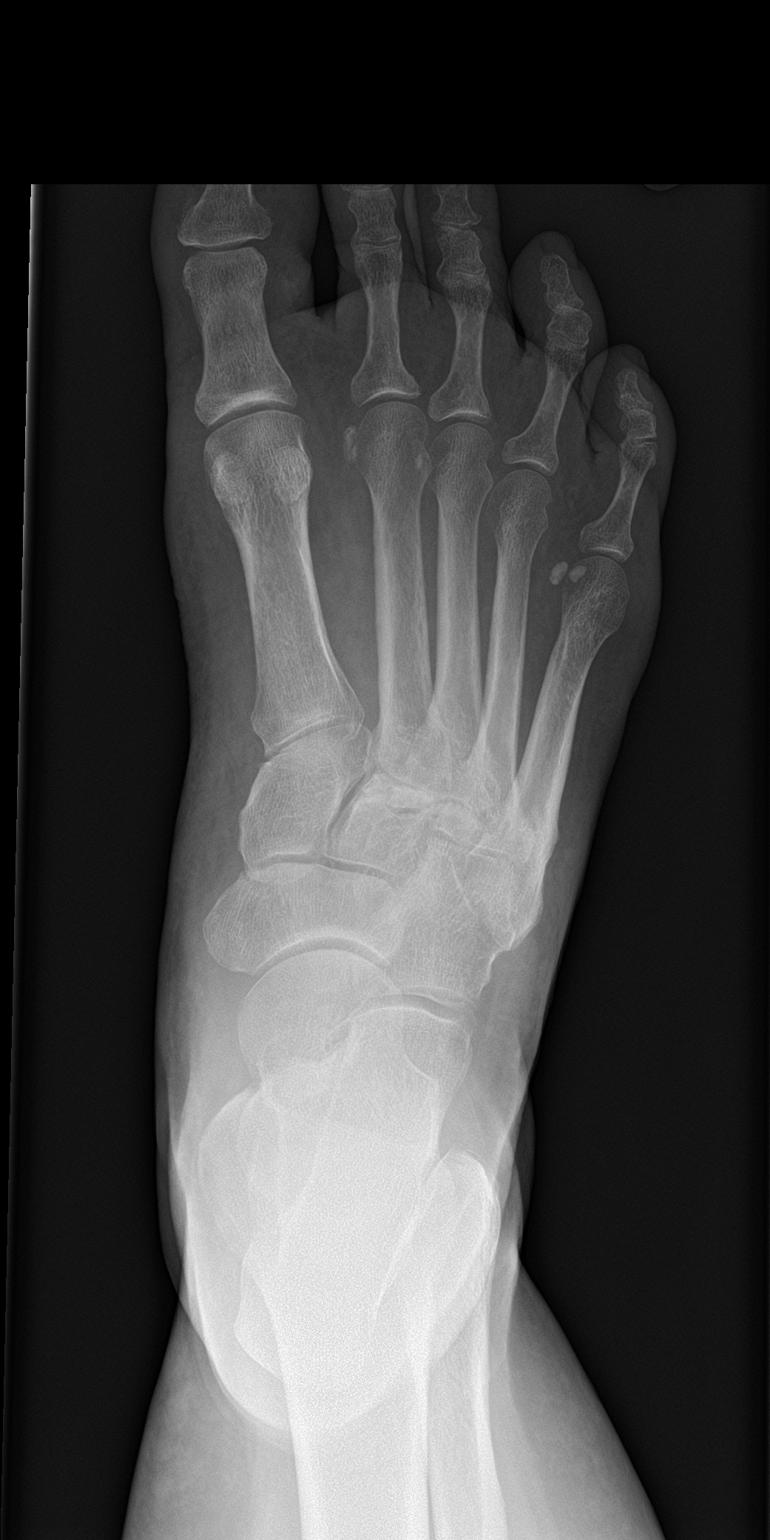

[foot obl]
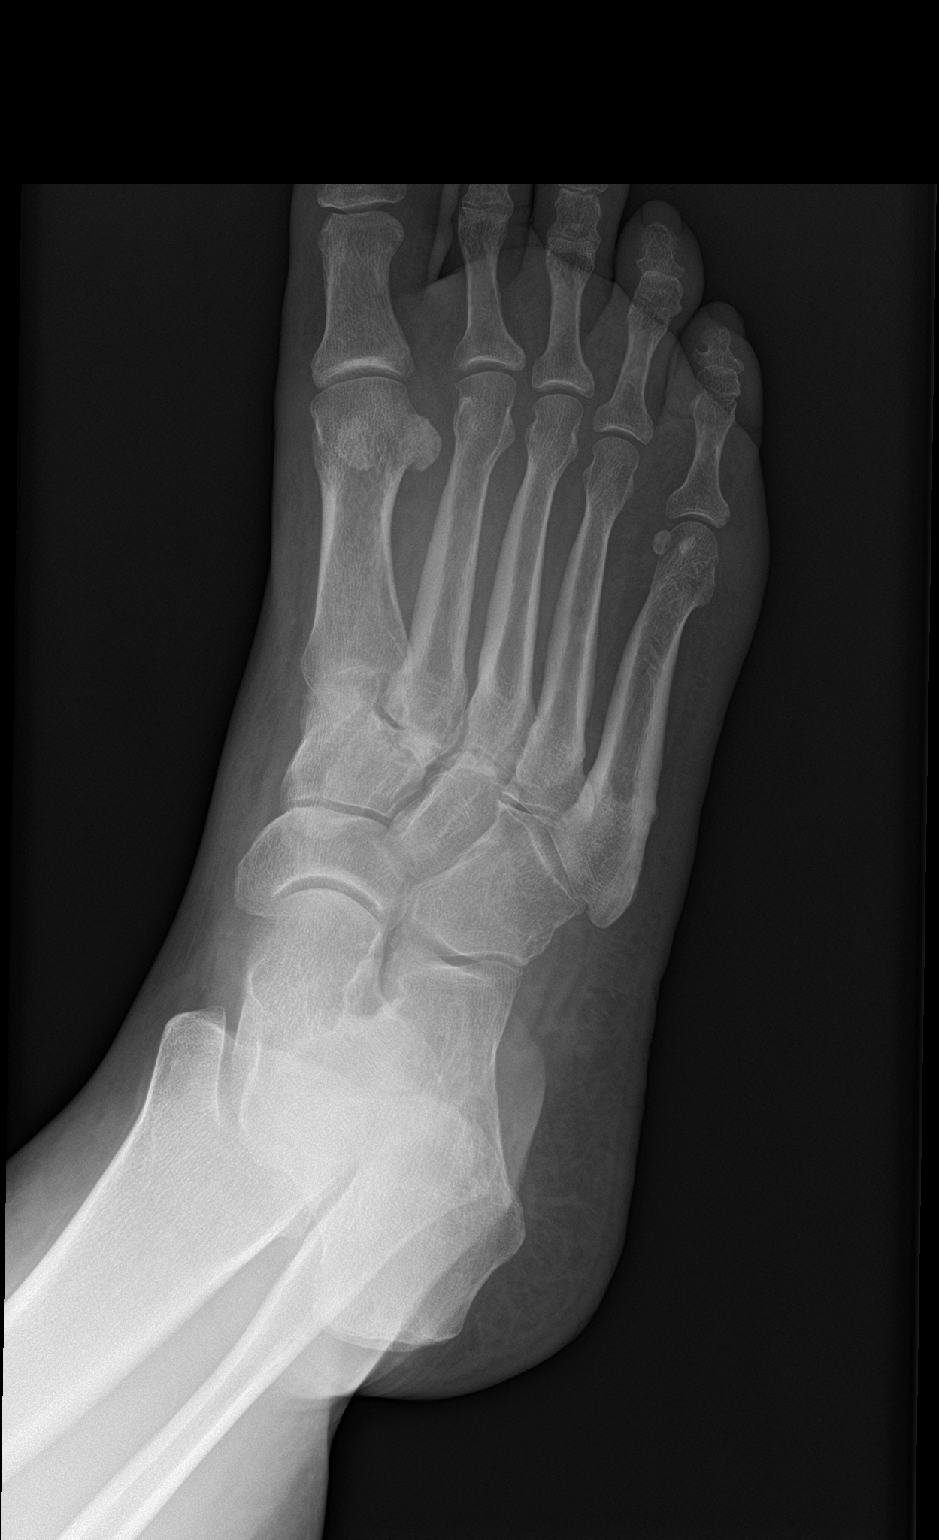

[foot lat]
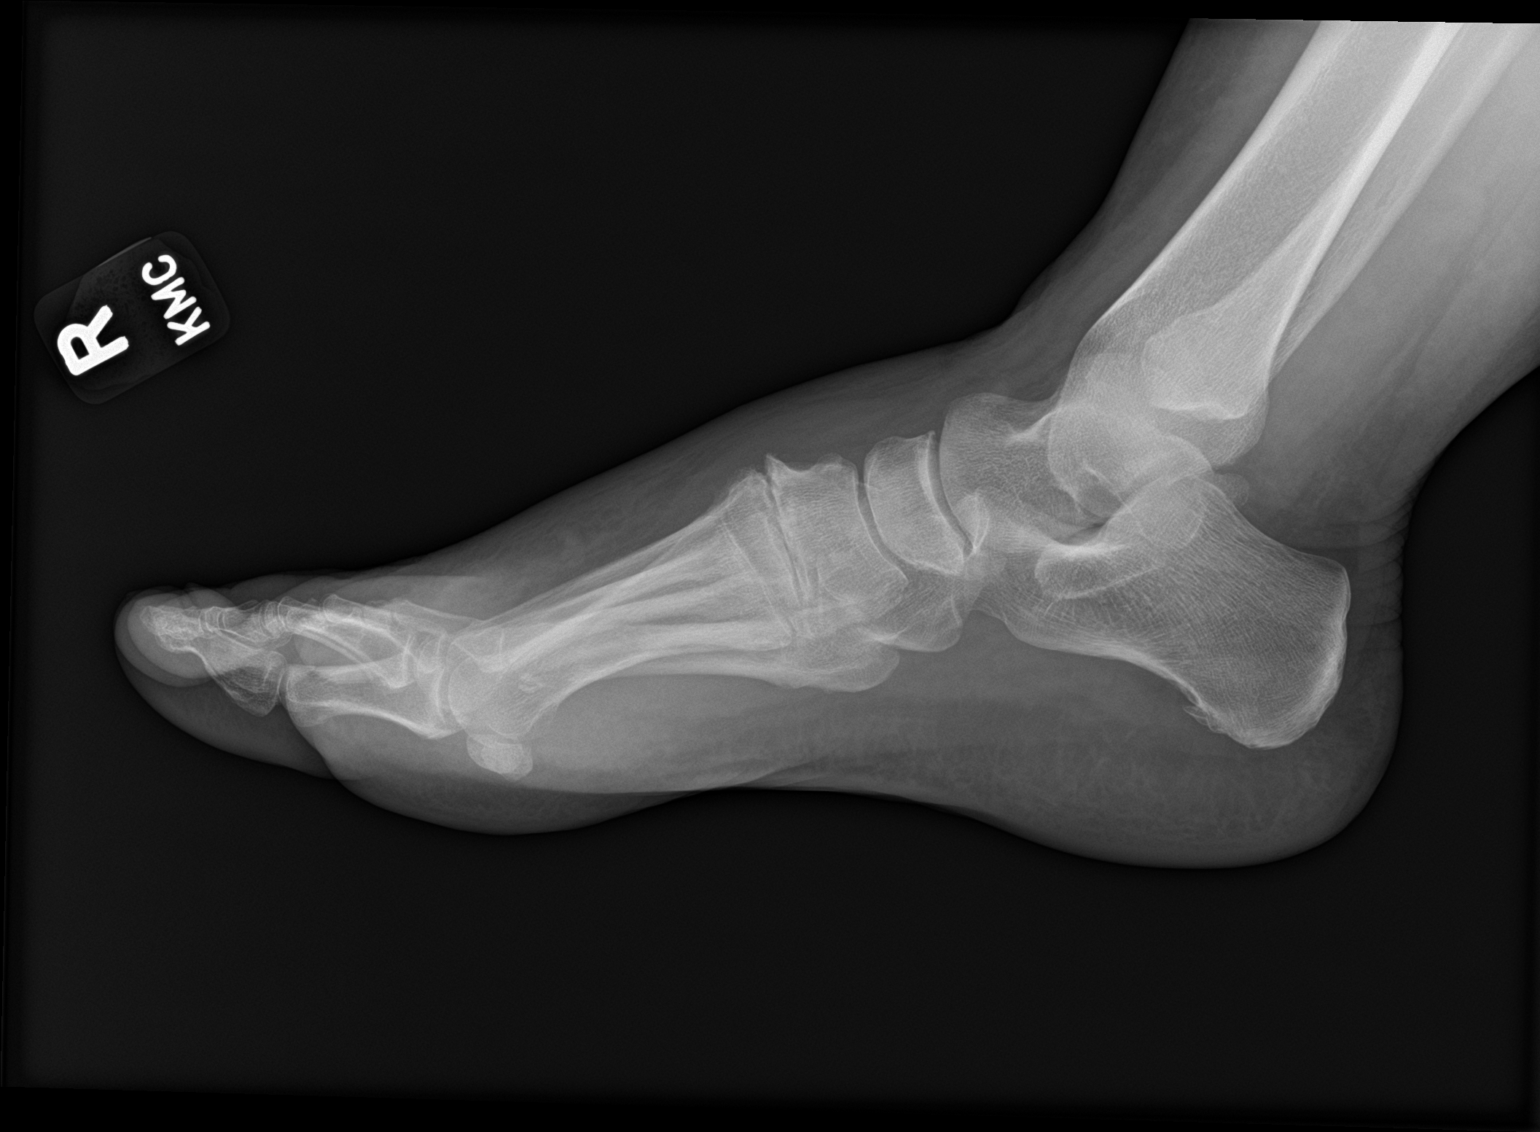

[3 of 3 positions shown; findings below may reference images not displayed]

FINDINGS: There is no evidence of fracture or dislocation. There are
degenerative changes in the midfoot. No focal bone lesion. There is
dorsal soft tissue swelling.
IMPRESSION: No acute osseous abnormality identified in the right foot.
